# Patient Record
Sex: Female | Born: 1980 | Race: Black or African American | Hispanic: No | Marital: Married | Smoking: Never smoker
Health system: Southern US, Community
[De-identification: ages and names within clinical notes are randomized; demographics above are authoritative.]

## PROBLEM LIST (undated history)

## (undated) DIAGNOSIS — O24419 Gestational diabetes mellitus in pregnancy, unspecified control: Secondary | ICD-10-CM

## (undated) DIAGNOSIS — A609 Anogenital herpesviral infection, unspecified: Secondary | ICD-10-CM

## (undated) HISTORY — PX: NO PAST SURGERIES: SHX2092

---

## 2008-05-03 ENCOUNTER — Ambulatory Visit (HOSPITAL_COMMUNITY): Admission: RE | Admit: 2008-05-03 | Discharge: 2008-05-03 | Payer: Self-pay | Admitting: Obstetrics and Gynecology

## 2008-06-07 ENCOUNTER — Ambulatory Visit (HOSPITAL_COMMUNITY): Admission: RE | Admit: 2008-06-07 | Discharge: 2008-06-07 | Payer: Self-pay | Admitting: Obstetrics and Gynecology

## 2008-09-23 ENCOUNTER — Inpatient Hospital Stay (HOSPITAL_COMMUNITY): Admission: AD | Admit: 2008-09-23 | Discharge: 2008-09-24 | Payer: Self-pay | Admitting: Obstetrics & Gynecology

## 2008-09-30 ENCOUNTER — Inpatient Hospital Stay (HOSPITAL_COMMUNITY): Admission: AD | Admit: 2008-09-30 | Discharge: 2008-10-01 | Payer: Self-pay | Admitting: Obstetrics and Gynecology

## 2008-10-01 ENCOUNTER — Inpatient Hospital Stay (HOSPITAL_COMMUNITY): Admission: AD | Admit: 2008-10-01 | Discharge: 2008-10-03 | Payer: Self-pay | Admitting: Obstetrics and Gynecology

## 2011-01-20 ENCOUNTER — Encounter: Payer: Self-pay | Admitting: Obstetrics and Gynecology

## 2011-07-21 ENCOUNTER — Other Ambulatory Visit (HOSPITAL_COMMUNITY)
Admission: RE | Admit: 2011-07-21 | Discharge: 2011-07-21 | Disposition: A | Discharge status: Home / Self Care | Payer: BC Managed Care – PPO | Source: Ambulatory Visit | Attending: Obstetrics & Gynecology | Admitting: Obstetrics & Gynecology

## 2011-07-21 DIAGNOSIS — Z01419 Encounter for gynecological examination (general) (routine) without abnormal findings: Secondary | ICD-10-CM | POA: Insufficient documentation

## 2011-07-21 DIAGNOSIS — Z113 Encounter for screening for infections with a predominantly sexual mode of transmission: Secondary | ICD-10-CM | POA: Insufficient documentation

## 2011-09-29 LAB — CBC
HCT: 39.9
HCT: 40
HCT: 45.3
Hemoglobin: 13.4
Hemoglobin: 15
MCHC: 32.5
MCHC: 33.5
MCV: 94.6
MCV: 95.9
Platelets: 152
RBC: 4.23
RBC: 4.83
RDW: 14.1
WBC: 19.6 — ABNORMAL HIGH

## 2013-03-09 ENCOUNTER — Other Ambulatory Visit (HOSPITAL_COMMUNITY)
Admission: RE | Admit: 2013-03-09 | Discharge: 2013-03-09 | Disposition: A | Discharge status: Home / Self Care | Payer: BC Managed Care – PPO | Source: Ambulatory Visit | Attending: Obstetrics and Gynecology | Admitting: Obstetrics and Gynecology

## 2013-03-09 ENCOUNTER — Other Ambulatory Visit: Payer: Self-pay | Admitting: Obstetrics and Gynecology

## 2013-03-09 DIAGNOSIS — Z1151 Encounter for screening for human papillomavirus (HPV): Secondary | ICD-10-CM | POA: Insufficient documentation

## 2013-03-09 DIAGNOSIS — Z113 Encounter for screening for infections with a predominantly sexual mode of transmission: Secondary | ICD-10-CM | POA: Insufficient documentation

## 2013-03-09 DIAGNOSIS — Z01419 Encounter for gynecological examination (general) (routine) without abnormal findings: Secondary | ICD-10-CM | POA: Insufficient documentation

## 2015-12-21 ENCOUNTER — Encounter (HOSPITAL_COMMUNITY): Payer: Self-pay | Admitting: Emergency Medicine

## 2015-12-21 ENCOUNTER — Emergency Department (HOSPITAL_COMMUNITY): Payer: 59

## 2015-12-21 ENCOUNTER — Emergency Department (HOSPITAL_COMMUNITY)
Admission: EM | Admit: 2015-12-21 | Discharge: 2015-12-21 | Disposition: A | Discharge status: Home / Self Care | Payer: 59 | Attending: Emergency Medicine | Admitting: Emergency Medicine

## 2015-12-21 DIAGNOSIS — Y9389 Activity, other specified: Secondary | ICD-10-CM | POA: Insufficient documentation

## 2015-12-21 DIAGNOSIS — Y998 Other external cause status: Secondary | ICD-10-CM | POA: Diagnosis not present

## 2015-12-21 DIAGNOSIS — S39012A Strain of muscle, fascia and tendon of lower back, initial encounter: Secondary | ICD-10-CM | POA: Diagnosis not present

## 2015-12-21 DIAGNOSIS — T148XXA Other injury of unspecified body region, initial encounter: Secondary | ICD-10-CM

## 2015-12-21 DIAGNOSIS — Y92410 Unspecified street and highway as the place of occurrence of the external cause: Secondary | ICD-10-CM | POA: Insufficient documentation

## 2015-12-21 DIAGNOSIS — S3992XA Unspecified injury of lower back, initial encounter: Secondary | ICD-10-CM | POA: Diagnosis present

## 2015-12-21 DIAGNOSIS — S199XXA Unspecified injury of neck, initial encounter: Secondary | ICD-10-CM | POA: Insufficient documentation

## 2015-12-21 LAB — POC URINE PREG, ED: PREG TEST UR: NEGATIVE

## 2015-12-21 MED ORDER — IBUPROFEN 600 MG PO TABS: 600.0000 mg | ORAL_TABLET | Freq: Three times a day (TID) | ORAL | Status: DC

## 2015-12-21 MED ORDER — CYCLOBENZAPRINE HCL 5 MG PO TABS: 5.0000 mg | ORAL_TABLET | Freq: Three times a day (TID) | ORAL | Status: DC | PRN
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
## 2016-09-05 ENCOUNTER — Other Ambulatory Visit: Payer: Self-pay | Admitting: Obstetrics and Gynecology

## 2016-09-05 ENCOUNTER — Other Ambulatory Visit (HOSPITAL_COMMUNITY)
Admission: RE | Admit: 2016-09-05 | Discharge: 2016-09-05 | Disposition: A | Discharge status: Home / Self Care | Payer: 59 | Source: Ambulatory Visit | Attending: Obstetrics and Gynecology | Admitting: Obstetrics and Gynecology

## 2016-09-05 DIAGNOSIS — Z1151 Encounter for screening for human papillomavirus (HPV): Secondary | ICD-10-CM | POA: Diagnosis not present

## 2016-09-05 DIAGNOSIS — Z01419 Encounter for gynecological examination (general) (routine) without abnormal findings: Secondary | ICD-10-CM | POA: Diagnosis present

## 2016-09-09 LAB — CYTOLOGY - PAP

## 2016-12-29 NOTE — L&D Delivery Note (Signed)
Delivery Note At 6:51 AM a viable female was delivered via Vaginal, Spontaneous (Presentation: ;  ).  APGAR: 8, 9; weight pending.   Placenta status: to L&D .  Cord: none with the following complications:  Delivery complicated by shoulder/head dystocia- McRobert's and rotation performed.  Dystocia less than 1 min.  Anesthesia:  epidural Episiotomy:  none Lacerations:  none Suture Repair: n/a Est. Blood Loss (mL):  300cc  Mom to postpartum.  Baby to Couplet care / Skin to Skin.  Alessandra BevelsJennifer M Margrette Wynia 12/22/2017, 7:15 AM

## 2017-05-21 DIAGNOSIS — Z3481 Encounter for supervision of other normal pregnancy, first trimester: Secondary | ICD-10-CM | POA: Diagnosis not present

## 2017-05-21 DIAGNOSIS — Z3480 Encounter for supervision of other normal pregnancy, unspecified trimester: Secondary | ICD-10-CM | POA: Diagnosis not present

## 2017-05-21 LAB — OB RESULTS CONSOLE RUBELLA ANTIBODY, IGM: RUBELLA: IMMUNE

## 2017-05-21 LAB — OB RESULTS CONSOLE ABO/RH: RH Type: POSITIVE

## 2017-05-21 LAB — OB RESULTS CONSOLE ANTIBODY SCREEN: Antibody Screen: NEGATIVE

## 2017-05-21 LAB — OB RESULTS CONSOLE HIV ANTIBODY (ROUTINE TESTING): HIV: NONREACTIVE

## 2017-05-21 LAB — OB RESULTS CONSOLE RPR: RPR: NONREACTIVE

## 2017-05-21 LAB — OB RESULTS CONSOLE HEPATITIS B SURFACE ANTIGEN: Hepatitis B Surface Ag: NEGATIVE

## 2017-06-08 DIAGNOSIS — Z3481 Encounter for supervision of other normal pregnancy, first trimester: Secondary | ICD-10-CM | POA: Diagnosis not present

## 2017-06-08 LAB — OB RESULTS CONSOLE GC/CHLAMYDIA
Chlamydia: NEGATIVE
GC PROBE AMP, GENITAL: NEGATIVE

## 2017-06-10 DIAGNOSIS — Z3A12 12 weeks gestation of pregnancy: Secondary | ICD-10-CM | POA: Diagnosis not present

## 2017-06-10 DIAGNOSIS — O26841 Uterine size-date discrepancy, first trimester: Secondary | ICD-10-CM | POA: Diagnosis not present

## 2017-06-18 DIAGNOSIS — O09299 Supervision of pregnancy with other poor reproductive or obstetric history, unspecified trimester: Secondary | ICD-10-CM | POA: Diagnosis not present

## 2017-07-06 DIAGNOSIS — Z3482 Encounter for supervision of other normal pregnancy, second trimester: Secondary | ICD-10-CM | POA: Diagnosis not present

## 2017-08-04 DIAGNOSIS — Z36 Encounter for antenatal screening for chromosomal anomalies: Secondary | ICD-10-CM | POA: Diagnosis not present

## 2017-08-04 DIAGNOSIS — Z3482 Encounter for supervision of other normal pregnancy, second trimester: Secondary | ICD-10-CM | POA: Diagnosis not present

## 2017-09-10 DIAGNOSIS — Z23 Encounter for immunization: Secondary | ICD-10-CM | POA: Diagnosis not present

## 2017-10-08 DIAGNOSIS — Z3482 Encounter for supervision of other normal pregnancy, second trimester: Secondary | ICD-10-CM | POA: Diagnosis not present

## 2017-10-22 DIAGNOSIS — Z23 Encounter for immunization: Secondary | ICD-10-CM | POA: Diagnosis not present

## 2017-12-03 DIAGNOSIS — Z3483 Encounter for supervision of other normal pregnancy, third trimester: Secondary | ICD-10-CM | POA: Diagnosis not present

## 2017-12-10 DIAGNOSIS — Z3483 Encounter for supervision of other normal pregnancy, third trimester: Secondary | ICD-10-CM | POA: Diagnosis not present

## 2017-12-22 ENCOUNTER — Inpatient Hospital Stay (HOSPITAL_COMMUNITY): Payer: BLUE CROSS/BLUE SHIELD | Admitting: Anesthesiology

## 2017-12-22 ENCOUNTER — Inpatient Hospital Stay (HOSPITAL_COMMUNITY)
Admission: AD | Admit: 2017-12-22 | Discharge: 2017-12-24 | DRG: 807 | Disposition: A | Discharge status: Home / Self Care | Payer: BLUE CROSS/BLUE SHIELD | Source: Ambulatory Visit | Attending: Obstetrics & Gynecology | Admitting: Obstetrics & Gynecology

## 2017-12-22 ENCOUNTER — Encounter (HOSPITAL_COMMUNITY): Payer: Self-pay

## 2017-12-22 DIAGNOSIS — O2492 Unspecified diabetes mellitus in childbirth: Secondary | ICD-10-CM | POA: Diagnosis not present

## 2017-12-22 DIAGNOSIS — Z3A39 39 weeks gestation of pregnancy: Secondary | ICD-10-CM

## 2017-12-22 DIAGNOSIS — Z3483 Encounter for supervision of other normal pregnancy, third trimester: Secondary | ICD-10-CM | POA: Diagnosis not present

## 2017-12-22 HISTORY — DX: Gestational diabetes mellitus in pregnancy, unspecified control: O24.419

## 2017-12-22 HISTORY — DX: Anogenital herpesviral infection, unspecified: A60.9

## 2017-12-22 LAB — CBC
HEMATOCRIT: 41 % (ref 36.0–46.0)
Hemoglobin: 14 g/dL (ref 12.0–15.0)
MCH: 30.4 pg (ref 26.0–34.0)
MCHC: 34.1 g/dL (ref 30.0–36.0)
MCV: 88.9 fL (ref 78.0–100.0)
Platelets: 154 10*3/uL (ref 150–400)
RBC: 4.61 MIL/uL (ref 3.87–5.11)
RDW: 14.9 % (ref 11.5–15.5)
WBC: 13.9 10*3/uL — ABNORMAL HIGH (ref 4.0–10.5)

## 2017-12-22 LAB — ABO/RH: ABO/RH(D): O POS

## 2017-12-22 LAB — TYPE AND SCREEN
ABO/RH(D): O POS
Antibody Screen: NEGATIVE

## 2017-12-22 LAB — RPR: RPR Ser Ql: NONREACTIVE

## 2017-12-22 LAB — POCT FERN TEST: POCT Fern Test: POSITIVE

## 2017-12-22 MED ORDER — EPHEDRINE 5 MG/ML INJ
10.0000 mg | INTRAVENOUS | Status: DC | PRN
  Filled 2017-12-22: qty 2

## 2017-12-22 MED ORDER — FERROUS SULFATE 325 (65 FE) MG PO TABS
325.0000 mg | ORAL_TABLET | Freq: Two times a day (BID) | ORAL | Status: DC
  Administered 2017-12-22 – 2017-12-24 (×4): 325 mg via ORAL
  Filled 2017-12-22 (×4): qty 1

## 2017-12-22 MED ORDER — ACETAMINOPHEN 325 MG PO TABS: 650.0000 mg | ORAL_TABLET | ORAL | Status: DC | PRN

## 2017-12-22 MED ORDER — ONDANSETRON HCL 4 MG/2ML IJ SOLN: 4.0000 mg | Freq: Four times a day (QID) | INTRAMUSCULAR | Status: DC | PRN

## 2017-12-22 MED ORDER — SIMETHICONE 80 MG PO CHEW: 80.0000 mg | CHEWABLE_TABLET | ORAL | Status: DC | PRN

## 2017-12-22 MED ORDER — ACETAMINOPHEN 325 MG PO TABS
650.0000 mg | ORAL_TABLET | ORAL | Status: DC | PRN
  Administered 2017-12-24: 650 mg via ORAL
  Filled 2017-12-22: qty 2

## 2017-12-22 MED ORDER — LACTATED RINGERS IV SOLN: 500.0000 mL | INTRAVENOUS | Status: DC | PRN

## 2017-12-22 MED ORDER — LACTATED RINGERS IV SOLN
INTRAVENOUS | Status: DC
  Administered 2017-12-22: 05:00:00 via INTRAVENOUS

## 2017-12-22 MED ORDER — TETANUS-DIPHTH-ACELL PERTUSSIS 5-2.5-18.5 LF-MCG/0.5 IM SUSP: 0.5000 mL | Freq: Once | INTRAMUSCULAR | Status: DC

## 2017-12-22 MED ORDER — OXYCODONE-ACETAMINOPHEN 5-325 MG PO TABS: 2.0000 | ORAL_TABLET | ORAL | Status: DC | PRN

## 2017-12-22 MED ORDER — OXYCODONE-ACETAMINOPHEN 5-325 MG PO TABS: 1.0000 | ORAL_TABLET | ORAL | Status: DC | PRN

## 2017-12-22 MED ORDER — IBUPROFEN 600 MG PO TABS: 600.0000 mg | ORAL_TABLET | Freq: Four times a day (QID) | ORAL | Status: DC

## 2017-12-22 MED ORDER — PHENYLEPHRINE 40 MCG/ML (10ML) SYRINGE FOR IV PUSH (FOR BLOOD PRESSURE SUPPORT)
80.0000 ug | PREFILLED_SYRINGE | INTRAVENOUS | Status: DC | PRN
  Filled 2017-12-22: qty 5

## 2017-12-22 MED ORDER — OXYTOCIN 40 UNITS IN LACTATED RINGERS INFUSION - SIMPLE MED
1.0000 m[IU]/min | INTRAVENOUS | Status: DC
  Administered 2017-12-22: 1 m[IU]/min via INTRAVENOUS

## 2017-12-22 MED ORDER — ONDANSETRON HCL 4 MG PO TABS: 4.0000 mg | ORAL_TABLET | ORAL | Status: DC | PRN

## 2017-12-22 MED ORDER — SOD CITRATE-CITRIC ACID 500-334 MG/5ML PO SOLN: 30.0000 mL | ORAL | Status: DC | PRN

## 2017-12-22 MED ORDER — OXYTOCIN 40 UNITS IN LACTATED RINGERS INFUSION - SIMPLE MED: 2.5000 [IU]/h | INTRAVENOUS | Status: DC | PRN

## 2017-12-22 MED ORDER — DIPHENHYDRAMINE HCL 25 MG PO CAPS: 25.0000 mg | ORAL_CAPSULE | Freq: Four times a day (QID) | ORAL | Status: DC | PRN

## 2017-12-22 MED ORDER — COCONUT OIL OIL
1.0000 "application " | TOPICAL_OIL | Status: DC | PRN
  Administered 2017-12-23: 1 via TOPICAL
  Filled 2017-12-22: qty 120

## 2017-12-22 MED ORDER — METHYLERGONOVINE MALEATE 0.2 MG PO TABS: 0.2000 mg | ORAL_TABLET | ORAL | Status: DC | PRN

## 2017-12-22 MED ORDER — PRENATAL MULTIVITAMIN CH
1.0000 | ORAL_TABLET | Freq: Every day | ORAL | Status: DC
  Administered 2017-12-22 – 2017-12-24 (×3): 1 via ORAL
  Filled 2017-12-22 (×3): qty 1

## 2017-12-22 MED ORDER — SENNOSIDES-DOCUSATE SODIUM 8.6-50 MG PO TABS
2.0000 | ORAL_TABLET | ORAL | Status: DC
  Administered 2017-12-22 – 2017-12-23 (×2): 2 via ORAL
  Filled 2017-12-22: qty 2

## 2017-12-22 MED ORDER — FENTANYL 2.5 MCG/ML BUPIVACAINE 1/10 % EPIDURAL INFUSION (WH - ANES)
INTRAMUSCULAR | Status: AC
  Filled 2017-12-22: qty 100

## 2017-12-22 MED ORDER — FLEET ENEMA 7-19 GM/118ML RE ENEM: 1.0000 | ENEMA | RECTAL | Status: DC | PRN

## 2017-12-22 MED ORDER — METHYLERGONOVINE MALEATE 0.2 MG/ML IJ SOLN: 0.2000 mg | INTRAMUSCULAR | Status: DC | PRN

## 2017-12-22 MED ORDER — ONDANSETRON HCL 4 MG/2ML IJ SOLN: 4.0000 mg | INTRAMUSCULAR | Status: DC | PRN

## 2017-12-22 MED ORDER — BENZOCAINE-MENTHOL 20-0.5 % EX AERO: 1.0000 "application " | INHALATION_SPRAY | CUTANEOUS | Status: DC | PRN

## 2017-12-22 MED ORDER — OXYTOCIN BOLUS FROM INFUSION
500.0000 mL | Freq: Once | INTRAVENOUS | Status: AC
  Administered 2017-12-22: 500 mL via INTRAVENOUS

## 2017-12-22 MED ORDER — FENTANYL 2.5 MCG/ML BUPIVACAINE 1/10 % EPIDURAL INFUSION (WH - ANES)
14.0000 mL/h | INTRAMUSCULAR | Status: DC | PRN
  Administered 2017-12-22: 14 mL/h via EPIDURAL

## 2017-12-22 MED ORDER — DIPHENHYDRAMINE HCL 50 MG/ML IJ SOLN: 12.5000 mg | INTRAMUSCULAR | Status: DC | PRN

## 2017-12-22 MED ORDER — ZOLPIDEM TARTRATE 5 MG PO TABS: 5.0000 mg | ORAL_TABLET | Freq: Every evening | ORAL | Status: DC | PRN

## 2017-12-22 MED ORDER — LIDOCAINE HCL (PF) 1 % IJ SOLN
INTRAMUSCULAR | Status: DC | PRN
  Administered 2017-12-22 (×2): 5 mL

## 2017-12-22 MED ORDER — WITCH HAZEL-GLYCERIN EX PADS: 1.0000 "application " | MEDICATED_PAD | CUTANEOUS | Status: DC | PRN

## 2017-12-22 MED ORDER — IBUPROFEN 600 MG PO TABS
600.0000 mg | ORAL_TABLET | Freq: Four times a day (QID) | ORAL | Status: DC
  Administered 2017-12-22 – 2017-12-24 (×9): 600 mg via ORAL
  Filled 2017-12-22 (×7): qty 1

## 2017-12-22 MED ORDER — LACTATED RINGERS IV SOLN: 500.0000 mL | Freq: Once | INTRAVENOUS | Status: DC

## 2017-12-22 MED ORDER — DIBUCAINE 1 % RE OINT: 1.0000 "application " | TOPICAL_OINTMENT | RECTAL | Status: DC | PRN

## 2017-12-22 MED ORDER — OXYTOCIN 40 UNITS IN LACTATED RINGERS INFUSION - SIMPLE MED
2.5000 [IU]/h | INTRAVENOUS | Status: DC
  Filled 2017-12-22: qty 1000

## 2017-12-22 MED ORDER — LIDOCAINE HCL (PF) 1 % IJ SOLN
30.0000 mL | INTRAMUSCULAR | Status: DC | PRN
  Filled 2017-12-22: qty 30

## 2017-12-22 MED ORDER — PHENYLEPHRINE 40 MCG/ML (10ML) SYRINGE FOR IV PUSH (FOR BLOOD PRESSURE SUPPORT)
PREFILLED_SYRINGE | INTRAVENOUS | Status: AC
  Filled 2017-12-22: qty 20

## 2017-12-22 MED ORDER — TERBUTALINE SULFATE 1 MG/ML IJ SOLN
0.2500 mg | Freq: Once | INTRAMUSCULAR | Status: DC | PRN
  Filled 2017-12-22: qty 1

## 2017-12-22 MED ORDER — MAGNESIUM HYDROXIDE 400 MG/5ML PO SUSP: 30.0000 mL | ORAL | Status: DC | PRN

## 2017-12-22 NOTE — Lactation Note (Signed)
This note was copied from a baby's chart. Lactation Consultation Note  Patient Name: Patricia Page ZOXWR'UToday's Date: 12/22/2017 Reason for consult: Initial assessment;Term Breastfeeding consultation services and support information given and reviewed.  Mom states she breastfed her first baby without problems.  Newborn is 7 hours old and latching to breast easily per mom.  Instructed to feed baby with cues.  Encouraged to call for assist/concerns prn.  Maternal Data Does the patient have breastfeeding experience prior to this delivery?: Yes  Feeding Feeding Type: Breast Fed Length of feed: 20 min  LATCH Score                   Interventions    Lactation Tools Discussed/Used     Consult Status Consult Status: Follow-up Date: 12/23/17 Follow-up type: In-patient    Huston FoleyMOULDEN, Brycin Kille S 12/22/2017, 1:57 PM

## 2017-12-22 NOTE — Anesthesia Postprocedure Evaluation (Signed)
Anesthesia Post Note  Patient: Patricia Page  Procedure(s) Performed: AN AD HOC LABOR EPIDURAL     Patient location during evaluation: Mother Baby Level of consciousness: awake and alert and oriented Pain management: pain level controlled Vital Signs Assessment: post-procedure vital signs reviewed and stable Respiratory status: spontaneous breathing and nonlabored ventilation Cardiovascular status: stable Postop Assessment: no headache, no apparent nausea or vomiting, no backache, adequate PO intake, epidural receding and patient able to bend at knees Anesthetic complications: no    Last Vitals:  Vitals:   12/22/17 0801 12/22/17 0825  BP: 124/75 (!) 144/76  Pulse: 75 66  Resp: 17 17  Temp:  36.8 C  SpO2:      Last Pain:  Vitals:   12/22/17 0825  TempSrc: Oral  PainSc:    Pain Goal:                 Laban EmperorMalinova,Patricia Page

## 2017-12-22 NOTE — Anesthesia Procedure Notes (Signed)
Epidural Patient location during procedure: OB  Staffing Anesthesiologist: Maximiano Lott, MD Performed: anesthesiologist   Preanesthetic Checklist Completed: patient identified, site marked, surgical consent, pre-op evaluation, timeout performed, IV checked, risks and benefits discussed and monitors and equipment checked  Epidural Patient position: sitting Prep: DuraPrep Patient monitoring: heart rate, continuous pulse ox and blood pressure Approach: right paramedian Location: L3-L4 Injection technique: LOR saline  Needle:  Needle type: Tuohy  Needle gauge: 17 G Needle length: 9 cm and 9 Needle insertion depth: 7 cm Catheter type: closed end flexible Catheter size: 20 Guage Catheter at skin depth: 11 cm Test dose: negative  Assessment Events: blood not aspirated, injection not painful, no injection resistance, negative IV test and no paresthesia  Additional Notes Patient identified. Risks/Benefits/Options discussed with patient including but not limited to bleeding, infection, nerve damage, paralysis, failed block, incomplete pain control, headache, blood pressure changes, nausea, vomiting, reactions to medication both or allergic, itching and postpartum back pain. Confirmed with bedside nurse the patient's most recent platelet count. Confirmed with patient that they are not currently taking any anticoagulation, have any bleeding history or any family history of bleeding disorders. Patient expressed understanding and wished to proceed. All questions were answered. Sterile technique was used throughout the entire procedure. Please see nursing notes for vital signs. Test dose was given through epidural needle and negative prior to continuing to dose epidural or start infusion. Warning signs of high block given to the patient including shortness of breath, tingling/numbness in hands, complete motor block, or any concerning symptoms with instructions to call for help. Patient was given  instructions on fall risk and not to get out of bed. All questions and concerns addressed with instructions to call with any issues.     

## 2017-12-22 NOTE — H&P (Signed)
HPI: 36 y/o G2P1001 @ 371w2d estimated gestational age (as dated by LMP c/w 20 week ultrasound) presents complaining of painful contractions that started around 10:30pm and are now every 5min.  Also, on arrival around 0145 her water broke as well.  no Vaginal Bleeding,   + Uterine Contractions,  + Fetal Movement.  Prenatal care has been provided by Dr. Dion BodyVarnado  ROS: no HA, no epigastric pain, no visual changes.    Pregnancy uncomplicated   Prenatal Transfer Tool  Maternal Diabetes: No Genetic Screening: Normal Maternal Ultrasounds/Referrals: Normal Fetal Ultrasounds or other Referrals:  None Maternal Substance Abuse:  No Significant Maternal Medications:  None Significant Maternal Lab Results: Lab values include: Group B Strep negative   PNL:  GBS neg, Rub Immune, Hep B neg, RPR NR, HIV neg, GC/C neg, glucola:89 Hgb: 12.9 Blood type: O pos, antibody neg  Immunizations: Tdap: 10/25 Flu: 9/13  OBHx: FTNSVD, 6#4oz, GDMA1 PMHx:  none Meds:  PNV Allergy:  No Known Allergies SurgHx: none SocHx:   no Tobacco, no  EtOH, no Illicit Drugs  O: BP 126/83 (BP Location: Left Arm)   Pulse 72   Temp 98.6 F (37 C) (Oral)   Resp 16   Ht 5\' 5"  (1.651 m)   Wt 100.7 kg (222 lb)   BMI 36.94 kg/m    Gen. AAOx3, NAD CV.  RRR Resp. Normal respiratory rate and effort Abd. Gravid,  no tenderness,  no rigidity,  no guarding Extr.  1+ edema B/L , no calf tenderness, neg Homan's B/L  FHT: 145 baseline, mod variability, no accels,  no decels Toco: q 3 min SVE: per RN vertex, 5-6/80/-2  Labs:  Results for orders placed or performed during the hospital encounter of 12/22/17 (from the past 24 hour(s))  POCT fern test     Status: Abnormal   Collection Time: 12/22/17  1:59 AM  Result Value Ref Range   POCT Fern Test Positive = ruptured amniotic membanes   CBC     Status: Abnormal   Collection Time: 12/22/17  2:03 AM  Result Value Ref Range   WBC 13.9 (H) 4.0 - 10.5 K/uL   RBC 4.61 3.87 -  5.11 MIL/uL   Hemoglobin 14.0 12.0 - 15.0 g/dL   HCT 52.841.0 41.336.0 - 24.446.0 %   MCV 88.9 78.0 - 100.0 fL   MCH 30.4 26.0 - 34.0 pg   MCHC 34.1 30.0 - 36.0 g/dL   RDW 01.014.9 27.211.5 - 53.615.5 %   Platelets 154 150 - 400 K/uL    A/P:  36 y.o. G2P1001 @ 931w2d EGA who presents for active labor -FWB:  NICHD Cat I FHTs -Labor: expectant management -GBS: negative -Pain management: plan for epidural  Myna HidalgoJennifer Kendrix Orman, DO [phone] (cell) [phone] (office)

## 2017-12-22 NOTE — Anesthesia Preprocedure Evaluation (Signed)
Anesthesia Evaluation  Patient identified by MRN, date of birth, ID band Patient awake    Reviewed: Allergy & Precautions, H&P , NPO status , Patient's Chart, lab work & pertinent test results  History of Anesthesia Complications Negative for: history of anesthetic complications  Airway Mallampati: II  TM Distance: >3 FB Neck ROM: full    Dental no notable dental hx. (+) Teeth Intact   Pulmonary neg pulmonary ROS,    Pulmonary exam normal breath sounds clear to auscultation       Cardiovascular negative cardio ROS Normal cardiovascular exam Rhythm:regular Rate:Normal     Neuro/Psych negative neurological ROS  negative psych ROS   GI/Hepatic negative GI ROS, Neg liver ROS,   Endo/Other  negative endocrine ROSdiabetes  Renal/GU negative Renal ROS  negative genitourinary   Musculoskeletal   Abdominal   Peds  Hematology negative hematology ROS (+)   Anesthesia Other Findings   Reproductive/Obstetrics (+) Pregnancy                             Anesthesia Physical  Anesthesia Plan  ASA: II  Anesthesia Plan: Epidural   Post-op Pain Management:    Induction:   PONV Risk Score and Plan:   Airway Management Planned:   Additional Equipment:   Intra-op Plan:   Post-operative Plan:   Informed Consent: I have reviewed the patients History and Physical, chart, labs and discussed the procedure including the risks, benefits and alternatives for the proposed anesthesia with the patient or authorized representative who has indicated his/her understanding and acceptance.       Plan Discussed with:   Anesthesia Plan Comments:         Anesthesia Quick Evaluation  

## 2017-12-23 LAB — CBC
HEMATOCRIT: 38 % (ref 36.0–46.0)
HEMOGLOBIN: 12.7 g/dL (ref 12.0–15.0)
MCH: 30.5 pg (ref 26.0–34.0)
MCHC: 33.4 g/dL (ref 30.0–36.0)
MCV: 91.3 fL (ref 78.0–100.0)
Platelets: 129 10*3/uL — ABNORMAL LOW (ref 150–400)
RBC: 4.16 MIL/uL (ref 3.87–5.11)
RDW: 15.5 % (ref 11.5–15.5)
WBC: 16.8 10*3/uL — ABNORMAL HIGH (ref 4.0–10.5)

## 2017-12-23 NOTE — Lactation Note (Signed)
This note was copied from a baby's chart. Lactation Consultation Note  Patient Name: Patricia Page ZOXWR'UToday's Date: 12/23/2017 Reason for consult: Follow-up assessment;Hyperbilirubinemia   Follow up with mom of 35 hour old infant. Infant under phototherapy. Infant has been sleepy today but is cluster feeding since 4: 30 per mom. Discussed normalcy of cluster feeding and to feed infant on demand. Infant with 6 BF for 15-60 minutes, 3 BF attempts. 3 voids and 1 stool in last 24 hours. LATCH scores 8-9. Infant weight 8 lb 0.6 oz with weight loss of 4% since birth.   Mom latched infant independently whole on bili blanket. Infant needed stimulation to maintain suckling. Enc mom to use stimulation as needed to keep infant actively feeding.   Worked with mom on hand expression, no colostrum noted at this time.   DEBP set up with instructions for use on initiate setting. Mom was shown assembly, disassembly and cleaning of pump parts. Enc mom to pump every 2-3 hours post BF for 15 minutes and follow with hand expression. advised mom to feed all EBM to infant via spoon, spoons were left in room. Discussed with mom that if feedings are not gong well and the breast and not able to express colostrum then formula may need to be used to prevent dehydration in the infant. Mom reports she had to supplement her older child until her milk came in .   Mom voiced she has no further questions/concerns at this time. Enc mom to call out for feeding assistance as needed.    Maternal Data Formula Feeding for Exclusion: No Has patient been taught Hand Expression?: Yes Does the patient have breastfeeding experience prior to this delivery?: Yes  Feeding Feeding Type: Breast Fed Length of feed: 10 min(still BF when LC left room)  LATCH Score Latch: Repeated attempts needed to sustain latch, nipple held in mouth throughout feeding, stimulation needed to elicit sucking reflex.  Audible Swallowing: A few with  stimulation  Type of Nipple: Everted at rest and after stimulation  Comfort (Breast/Nipple): Soft / non-tender  Hold (Positioning): Assistance needed to correctly position infant at breast and maintain latch.  LATCH Score: 7  Interventions Interventions: Breast feeding basics reviewed;Adjust position;DEBP;Assisted with latch;Skin to skin;Support pillows;Position options;Hand express;Breast massage;Breast compression  Lactation Tools Discussed/Used Tools: Pump Breast pump type: Double-Electric Breast Pump Pump Review: Setup, frequency, and cleaning;Milk Storage Initiated by:: Patricia StainSharon Kerstin Crusoe, RN, IBCLC   Consult Status Consult Status: Follow-up Date: 12/24/17 Follow-up type: In-patient    Silas FloodSharon S Sheketa Ende 12/23/2017, 6:02 PM

## 2017-12-23 NOTE — Discharge Instructions (Signed)
Postpartum Care After Vaginal Delivery °The period of time right after you deliver your newborn is called the postpartum period. °What kind of medical care will I receive? °· You may continue to receive fluids and medicines through an IV tube inserted into one of your veins. °· If an incision was made near your vagina (episiotomy) or if you had some vaginal tearing during delivery, cold compresses may be placed on your episiotomy or your tear. This helps to reduce pain and swelling. °· You may be given a squirt bottle to use when you go to the bathroom. You may use this until you are comfortable wiping as usual. To use the squirt bottle, follow these steps: °? Before you urinate, fill the squirt bottle with warm water. Do not use hot water. °? After you urinate, while you are sitting on the toilet, use the squirt bottle to rinse the area around your urethra and vaginal opening. This rinses away any urine and blood. °? You may do this instead of wiping. As you start healing, you may use the squirt bottle before wiping yourself. Make sure to wipe gently. °? Fill the squirt bottle with clean water every time you use the bathroom. °· You will be given sanitary pads to wear. °How can I expect to feel? °· You may not feel the need to urinate for several hours after delivery. °· You will have some soreness and pain in your abdomen and vagina. °· If you are breastfeeding, you may have uterine contractions every time you breastfeed for up to several weeks postpartum. Uterine contractions help your uterus return to its normal size. °· It is normal to have vaginal bleeding (lochia) after delivery. The amount and appearance of lochia is often similar to a menstrual period in the first week after delivery. It will gradually decrease over the next few weeks to a dry, yellow-brown discharge. For most women, lochia stops completely by 6-8 weeks after delivery. Vaginal bleeding can vary from woman to woman. °· Within the first few  days after delivery, you may have breast engorgement. This is when your breasts feel heavy, full, and uncomfortable. Your breasts may also throb and feel hard, tightly stretched, warm, and tender. After this occurs, you may have milk leaking from your breasts. Your health care provider can help you relieve discomfort due to breast engorgement. Breast engorgement should go away within a few days. °· You may feel more sad or worried than normal due to hormonal changes after delivery. These feelings should not last more than a few days. If these feelings do not go away after several days, speak with your health care provider. °How should I care for myself? °· Tell your health care provider if you have pain or discomfort. °· Drink enough water to keep your urine clear or pale yellow. °· Wash your hands thoroughly with soap and water for at least 20 seconds after changing your sanitary pads, after using the toilet, and before holding or feeding your baby. °· If you are not breastfeeding, avoid touching your breasts a lot. Doing this can make your breasts produce more milk. °· If you become weak or lightheaded, or you feel like you might faint, ask for help before: °? Getting out of bed. °? Showering. °· Change your sanitary pads frequently. Watch for any changes in your flow, such as a sudden increase in volume, a change in color, the passing of large blood clots. If you pass a blood clot from your vagina, save it   to show to your health care provider. Do not flush blood clots down the toilet without having your health care provider look at them. °· Make sure that all your vaccinations are up to date. This can help protect you and your baby from getting certain diseases. You may need to have immunizations done before you leave the hospital. °· If desired, talk with your health care provider about methods of family planning or birth control (contraception). °How can I start bonding with my baby? °Spending as much time as  possible with your baby is very important. During this time, you and your baby can get to know each other and develop a bond. Having your baby stay with you in your room (rooming in) can give you time to get to know your baby. Rooming in can also help you become comfortable caring for your baby. Breastfeeding can also help you bond with your baby. °How can I plan for returning home with my baby? °· Make sure that you have a car seat installed in your vehicle. °? Your car seat should be checked by a certified car seat installer to make sure that it is installed safely. °? Make sure that your baby fits into the car seat safely. °· Ask your health care provider any questions you have about caring for yourself or your baby. Make sure that you are able to contact your health care provider with any questions after leaving the hospital. °This information is not intended to replace advice given to you by your health care provider. Make sure you discuss any questions you have with your health care provider. °Document Released: 10/12/2007 Document Revised: 05/19/2016 Document Reviewed: 11/19/2015 °Elsevier Interactive Patient Education © 2018 [employer] ° °

## 2017-12-23 NOTE — Progress Notes (Signed)
Postpartum day #1, NSVD  Subjective Pt without complaints.  Lochia normal.  Pain controlled.  Breast feeding yes  Temp:  [98 F (36.7 C)] 98 F (36.7 C) (12/25 2138) Pulse Rate:  [64-75] 72 (12/26 0523) Resp:  [16-18] 18 (12/25 2138) BP: (126-134)/(78-81) 134/81 (12/26 0523)  Gen:  NAD, A&O x 3 Uterine fundus:  Firm, nontender Lochia normal Ext:  +Edema, no calf tenderness bilaterally  CBC  CBC Latest Ref Rng & Units 12/23/2017 12/22/2017 10/03/2008  WBC 4.0 - 10.5 K/uL 16.8(H) 13.9(H) 15.9(H)  Hemoglobin 12.0 - 15.0 g/dL 40.912.7 81.114.0 91.413.0  Hematocrit 36.0 - 46.0 % 38.0 41.0 39.9  Platelets 150 - 400 K/uL 129(L) 154 152    A/P: S/p SVD doing well. Routine postpartum care. BP q 4 hours while in house.  BP high normal. Lactation support. Discharge in am.  Patricia Page 12/23/2017, 9:31 AM

## 2017-12-23 NOTE — Plan of Care (Signed)
Progressing appropriately.

## 2017-12-24 MED ORDER — IBUPROFEN 600 MG PO TABS: 600.0000 mg | ORAL_TABLET | Freq: Four times a day (QID) | ORAL | 0 refills | Status: AC

## 2017-12-24 NOTE — Lactation Note (Addendum)
This note was copied from a baby's chart. Lactation Consultation Note  Patient Name: Patricia Alwyn PeaCharey Riddell WUJWJ'XToday's Date: 12/24/2017 Reason for consult: Follow-up assessment;Hyperbilirubinemia;Other (Comment)(no stool in > 24 hours)   Follow up with mom of 50 hour old infant. Infant with 12 BF for 10-45 minutes, 4 BF attempts, 3 voids and 0 stools in the last 24 hours. Infant with 3 stools in life. Mom reports she is passing gas a lot. Infant weight 7 lb 12.5 oz with 7% weight loss since birth. LATCH scores 7-9. Phototherapy has been stopped and to have repeat bilirubin this morning.   Mom reports her breasts are feeling fuller and reports she hears more swallows with feedings. Infant was just coming off the breast when LC was in the room and nipple looked round. Mom denies any nipple pain/tenderness. Infant was in a deep sleep after feeding with moist lips noted.   Enc mom to keep feeding infant 8-12 x a day with feeding cues and to make sure infant stays awake with feeding.   Mom has DEBP set up, she reports she did not pump last night as she was too tired. Mom has Medela PIS at home and was advised to take pump tubing home with her.   Reviewed I/O. Signs of dehydration in the infant, engorgement prevention/treatment and breast milk expression and storage.  [facility] reviewed, mom aware of BF Support Groups and LC phone #. Mom reports she has no questions/concerns currently. Mom to call with any questions/concerns as needed.     Maternal Data Formula Feeding for Exclusion: No Has patient been taught Hand Expression?: Yes Does the patient have breastfeeding experience prior to this delivery?: Yes  Feeding Feeding Type: Breast Fed  LATCH Score                   Interventions Interventions: Breast feeding basics reviewed  Lactation Tools Discussed/Used Initiated by:: Reviewed and encouraged after BF   Consult Status Consult Status: Complete Follow-up type: Call as  needed    Patricia Page 12/24/2017, 9:39 AM

## 2017-12-24 NOTE — Discharge Summary (Signed)
OB Discharge Summary     Patient Name: Patricia Page DOB: 04/08/1981 MRN: [id number]  Date of admission: 12/22/2017 Delivering MD: Myna HidalgoZAN, JENNIFER   Date of discharge: 12/24/2017  Admitting diagnosis:  39wks water brokes ctx 3mins Intrauterine pregnancy: 6820w2d     Secondary diagnosis:  Active Problems:   Indication for care in labor or delivery  Additional problems: None     Discharge diagnosis: Term Pregnancy Delivered                                                                                                Post partum procedures:None  Augmentation: Pitocin  Complications: None  Hospital course:  Onset of Labor With Vaginal Delivery     36 y.o. yo G2P2002 at 2220w2d was admitted in Active Labor on 12/22/2017. Patient had an uncomplicated labor course as follows:  Membrane Rupture Time/Date: 1:45 AM ,12/22/2017   Intrapartum Procedures: Episiotomy: None [1]                                         Lacerations:  None [1]  Patient had a delivery of a Viable infant. 12/22/2017  Information for the patient's newborn:  Patricia Page, Patricia Page [[id number]]  Delivery Method: Vaginal, Spontaneous(Filed from Delivery Summary)    Pateint had an uncomplicated postpartum course.  She is ambulating, tolerating a regular diet, passing flatus, and urinating well. Patient is discharged home in stable condition on 12/24/17.   Physical exam  Vitals:   12/23/17 1330 12/23/17 1730 12/23/17 1831 12/24/17 0553  BP: 117/77 121/67 117/78 132/90  Pulse: 70 68 82 65  Resp:   20 18  Temp:   98.6 F (37 C) 98.2 F (36.8 C)  TempSrc:   Oral Oral  SpO2:      Weight:      Height:       General: alert, cooperative and no distress Lochia: appropriate Uterine Fundus: firm Incision: N/A DVT Evaluation: No evidence of DVT seen on physical exam. Calf/Ankle edema is present Labs: Lab Results  Component Value Date   WBC 16.8 (H) 12/23/2017   HGB 12.7 12/23/2017   HCT 38.0 12/23/2017   MCV  91.3 12/23/2017   PLT 129 (L) 12/23/2017   No flowsheet data found.  Discharge instruction: per After Visit Summary and "Baby and Me Booklet".  After visit meds:  Allergies as of 12/24/2017   No Known Allergies     Medication List    STOP taking these medications   cyclobenzaprine 5 MG tablet Commonly known as:  FLEXERIL   fluconazole 150 MG tablet Commonly known as:  DIFLUCAN     TAKE these medications   ibuprofen 600 MG tablet Commonly known as:  ADVIL,MOTRIN Take 1 tablet (600 mg total) by mouth every 6 (six) hours.   prenatal vitamin w/FE, FA 27-1 MG Tabs tablet Take 1 tablet by mouth daily at 12 noon.   ROLAIDS EXTRA STRENGTH 675-135 MG Chew Generic drug:  Ca Carbonate-Mag Hydroxide Chew 1 tablet  by mouth every 6 (six) hours as needed (heartburn). Rolaid       Diet: routine diet  Activity: Advance as tolerated. Pelvic rest for 6 weeks.   Outpatient follow up:6 weeks Follow up Appt:No future appointments. Follow up Visit:No Follow-up on file.  Postpartum contraception: Progesterone only pills  Newborn Data: Live born female  Birth Weight: 8 lb 6 oz (3799 g) APGAR: 8, 9  Newborn Delivery   Time head delivered:  12/22/2017 06:50:00 Birth date/time:  12/22/2017 06:51:00 Delivery type:  Vaginal, Spontaneous     Baby Feeding: Breast Disposition:home with mother   12/24/2017 Patricia RankinsEvelyn Tymeer Vaquera, MD

## 2019-12-16 ENCOUNTER — Emergency Department (INDEPENDENT_AMBULATORY_CARE_PROVIDER_SITE_OTHER): Payer: Managed Care, Other (non HMO)

## 2019-12-16 ENCOUNTER — Emergency Department (INDEPENDENT_AMBULATORY_CARE_PROVIDER_SITE_OTHER)
Admission: EM | Admit: 2019-12-16 | Discharge: 2019-12-16 | Disposition: A | Discharge status: Home / Self Care | Payer: Managed Care, Other (non HMO) | Source: Home / Self Care | Attending: Family Medicine | Admitting: Family Medicine

## 2019-12-16 ENCOUNTER — Other Ambulatory Visit: Payer: Self-pay

## 2019-12-16 DIAGNOSIS — R0789 Other chest pain: Secondary | ICD-10-CM

## 2019-12-16 DIAGNOSIS — R0602 Shortness of breath: Secondary | ICD-10-CM | POA: Diagnosis not present

## 2019-12-16 MED ORDER — OMEPRAZOLE 40 MG PO CPDR: DELAYED_RELEASE_CAPSULE | ORAL | 1 refills | Status: AC

## 2019-12-16 NOTE — ED Provider Notes (Signed)
Patricia Page [facility]    CSN: [id number] Arrival date & time: 12/16/19  1512      History   Chief Complaint Chief Complaint  Patient presents with  . Chest Pain  . Abdominal Pain    HPI Patricia Page is a 38 y.o. female.   Patient complains of 2 month history of intermittent recurring pressure-like chest and upper abdominal pain that occurs about twice per week.  During an episode she feels like her chest is "blocked," and when she "burps" the discomfort resolves.  She denies nausea/vomiting and cough.  She was evaluated in an ER for a similar episode in October.  Her EKG at that time was negative.  The history is provided by the patient.  Chest Pain Pain location:  Substernal area and epigastric Pain quality: pressure   Pain radiates to:  Does not radiate Pain severity:  Mild Onset quality:  Sudden Duration:  2 months Timing:  Sporadic Progression:  Unchanged Chronicity:  Recurrent Context: at rest   Context: not breathing, not eating, not lifting, not movement, not raising an arm and not stress   Relieved by:  None tried Worsened by:  Nothing Ineffective treatments:  Antacids Associated symptoms: abdominal pain   Associated symptoms: no AICD problem, no anorexia, no cough, no diaphoresis, no dysphagia, no fatigue, no fever, no heartburn, no nausea, no palpitations, no shortness of breath and no vomiting   Risk factors: obesity     Past Medical History:  Diagnosis Date  . Gestational diabetes   . HSV (herpes simplex virus) anogenital infection     Patient Active Problem List   Diagnosis Date Noted  . Indication for care in labor or delivery 12/22/2017    Past Surgical History:  Procedure Laterality Date  . NO PAST SURGERIES      OB History    Gravida  2   Para  2   Term  2   Preterm      AB      Living  2     SAB      TAB      Ectopic      Multiple  0   Live Births  1            Home Medications    Prior to Admission  medications   Medication Sig Start Date End Date Taking? Authorizing Provider  Ca Carbonate-Mag Hydroxide (ROLAIDS EXTRA STRENGTH) 675-135 MG CHEW Chew 1 tablet by mouth every 6 (six) hours as needed (heartburn). Rolaid    [provider]  ibuprofen (ADVIL,MOTRIN) 600 MG tablet Take 1 tablet (600 mg total) by mouth every 6 (six) hours. 12/24/17   Thurnell Lose, MD  omeprazole (PRILOSEC) 40 MG capsule Take one cap PO once daily, 30 minutes before a meal 12/16/19   Kandra Nicolas, MD  prenatal vitamin w/FE, FA (PRENATAL 1 + 1) 27-1 MG TABS tablet Take 1 tablet by mouth daily at 12 noon.    [provider]    Family History History reviewed. No pertinent family history.  Social History Social History   Tobacco Use  . Smoking status: Never Smoker  . Smokeless tobacco: Never Used  Substance Use Topics  . Alcohol use: No  . Drug use: No     Allergies   Patient has no known allergies.   Review of Systems Review of Systems  Constitutional: Negative for diaphoresis, fatigue and fever.  HENT: Negative for trouble swallowing.   Respiratory: Negative  for cough and shortness of breath.   Cardiovascular: Positive for chest pain. Negative for palpitations.  Gastrointestinal: Positive for abdominal pain. Negative for anorexia, heartburn, nausea and vomiting.     Physical Exam Triage Vital Signs ED Triage Vitals  Enc Vitals Group     BP 12/16/19 1700 135/86     Pulse Rate 12/16/19 1700 71     Resp 12/16/19 1700 20     Temp 12/16/19 1700 (!) 97.5 F (36.4 C)     Temp Source 12/16/19 1700 Oral     SpO2 12/16/19 1700 100 %     Weight 12/16/19 1701 183 lb (83 kg)     Height 12/16/19 1701 5\' 5"  (1.651 m)     Head Circumference --      Peak Flow --      Pain Score 12/16/19 1700 6     Pain Loc --      Pain Edu? --      Excl. in GC? --    No data found.  Updated Vital Signs BP 135/86 (BP Location: Right Arm)   Pulse 71   Temp (!) 97.5 F (36.4 C) (Oral)    Resp 20   Ht 5\' 5"  (1.651 m)   Wt 83 kg   LMP 12/07/2019   SpO2 100%   BMI 30.45 kg/m   Visual Acuity Right Eye Distance:   Left Eye Distance:   Bilateral Distance:    Right Eye Near:   Left Eye Near:    Bilateral Near:     Physical Exam Nursing notes and Vital Signs reviewed. Appearance:  Patient appears stated age, and in no acute distress.    Eyes:  Pupils are equal, round, and reactive to light and accomodation.  Extraocular movement is intact.  Conjunctivae are not inflamed   Pharynx:  Normal; moist mucous membranes  Neck:  Supple.  No adenopathy Lungs:  Clear to auscultation.  Breath sounds are equal.  Moving air well. Heart:  Regular rate and rhythm without murmurs, rubs, or gallops.  Abdomen:  Nontender without masses or hepatosplenomegaly.  Bowel sounds are present.  No CVA or flank tenderness.  Extremities:  No edema.  Skin:  No rash present.     UC Treatments / Results  Labs (all labs ordered are listed, but only abnormal results are displayed) Labs Reviewed - No data to display  EKG   Radiology DG Chest 2 View  Result Date: 12/16/2019 CLINICAL DATA:  Anterior chest pain and shortness of breath for 2 months. EXAM: CHEST - 2 VIEW COMPARISON:  None. FINDINGS: The heart size and mediastinal contours are within normal limits. Both lungs are clear. No evidence of pneumothorax or pleural effusion. The visualized skeletal structures are unremarkable. IMPRESSION: Normal study. Electronically Signed   By: Danae OrleansJohn A Stahl M.D.   On: 12/16/2019 18:40    Procedures Procedures (including critical care time)  Medications Ordered in UC Medications - No data to display  Initial Impression / Assessment and Plan / UC Course  I have reviewed the triage vital signs and the nursing notes.  Pertinent labs & imaging results that were available during my care of the patient were reviewed by me and considered in my medical decision making (see chart for details).    Suspect  GERD Begin trial of omeprazole 40mg  daily.  Followup with GI if not improved about 3 weeks.   Final Clinical Impressions(s) / UC Diagnoses   Final diagnoses:  Atypical chest pain  Discharge Instructions     If symptoms become significantly worse during the night or over the weekend, proceed to the local emergency room.     ED Prescriptions    Medication Sig Dispense Auth. Provider   omeprazole (PRILOSEC) 40 MG capsule Take one cap PO once daily, 30 minutes before a meal 15 capsule Lattie Haw, MD        Lattie Haw, MD 12/20/19 [id number]

## 2019-12-16 NOTE — Discharge Instructions (Addendum)
If symptoms become significantly worse during the night or over the weekend, proceed to the local emergency room.  

## 2019-12-16 NOTE — ED Triage Notes (Signed)
Pt states that she has been having chest pain that goes into her stomach, once she is able to burp, it is released.  Has been going on over 2 months.
